# Patient Record
Sex: Female | Born: 1970 | Race: Black or African American | Hispanic: No | Marital: Single | Smoking: Never smoker
Health system: Southern US, Community
[De-identification: ages and names within clinical notes are randomized; demographics above are authoritative.]

---

## 2017-10-10 ENCOUNTER — Emergency Department (HOSPITAL_BASED_OUTPATIENT_CLINIC_OR_DEPARTMENT_OTHER)
Admission: EM | Admit: 2017-10-10 | Discharge: 2017-10-10 | Disposition: A | Discharge status: Home / Self Care | Payer: Self-pay | Attending: Emergency Medicine | Admitting: Emergency Medicine

## 2017-10-10 ENCOUNTER — Other Ambulatory Visit: Payer: Self-pay

## 2017-10-10 ENCOUNTER — Emergency Department (HOSPITAL_BASED_OUTPATIENT_CLINIC_OR_DEPARTMENT_OTHER): Payer: Self-pay

## 2017-10-10 ENCOUNTER — Encounter (HOSPITAL_BASED_OUTPATIENT_CLINIC_OR_DEPARTMENT_OTHER): Payer: Self-pay | Admitting: Emergency Medicine

## 2017-10-10 DIAGNOSIS — M545 Low back pain, unspecified: Secondary | ICD-10-CM

## 2017-10-10 DIAGNOSIS — X500XXA Overexertion from strenuous movement or load, initial encounter: Secondary | ICD-10-CM | POA: Insufficient documentation

## 2017-10-10 DIAGNOSIS — R109 Unspecified abdominal pain: Secondary | ICD-10-CM | POA: Insufficient documentation

## 2017-10-10 DIAGNOSIS — R319 Hematuria, unspecified: Secondary | ICD-10-CM | POA: Insufficient documentation

## 2017-10-10 DIAGNOSIS — Y999 Unspecified external cause status: Secondary | ICD-10-CM | POA: Insufficient documentation

## 2017-10-10 DIAGNOSIS — Y9389 Activity, other specified: Secondary | ICD-10-CM | POA: Insufficient documentation

## 2017-10-10 DIAGNOSIS — Y929 Unspecified place or not applicable: Secondary | ICD-10-CM | POA: Insufficient documentation

## 2017-10-10 LAB — CBC
HCT: 36.7 % (ref 36.0–46.0)
Hemoglobin: 12.6 g/dL (ref 12.0–15.0)
MCH: 32.4 pg (ref 26.0–34.0)
MCHC: 34.3 g/dL (ref 30.0–36.0)
MCV: 94.3 fL (ref 78.0–100.0)
PLATELETS: 213 10*3/uL (ref 150–400)
RBC: 3.89 MIL/uL (ref 3.87–5.11)
RDW: 12.1 % (ref 11.5–15.5)
WBC: 7.7 10*3/uL (ref 4.0–10.5)

## 2017-10-10 LAB — URINALYSIS, MICROSCOPIC (REFLEX)

## 2017-10-10 LAB — URINALYSIS, ROUTINE W REFLEX MICROSCOPIC
BILIRUBIN URINE: NEGATIVE
Glucose, UA: NEGATIVE mg/dL
Ketones, ur: NEGATIVE mg/dL
LEUKOCYTES UA: NEGATIVE
NITRITE: NEGATIVE
PH: 6 (ref 5.0–8.0)
Protein, ur: NEGATIVE mg/dL

## 2017-10-10 LAB — PREGNANCY, URINE: PREG TEST UR: NEGATIVE

## 2017-10-10 MED ORDER — NAPROXEN 500 MG PO TABS: 500.0000 mg | ORAL_TABLET | Freq: Two times a day (BID) | ORAL | 0 refills | Status: AC

## 2017-10-10 MED ORDER — KETOROLAC TROMETHAMINE 15 MG/ML IJ SOLN
15.0000 mg | Freq: Once | INTRAMUSCULAR | Status: AC
  Administered 2017-10-10: 15 mg via INTRAVENOUS
  Filled 2017-10-10: qty 1

## 2017-10-10 MED ORDER — METHOCARBAMOL 500 MG PO TABS: 500.0000 mg | ORAL_TABLET | Freq: Three times a day (TID) | ORAL | 0 refills | Status: AC | PRN

## 2017-10-10 NOTE — Discharge Instructions (Addendum)
You were seen in the emergency department today for back pain.  Your work-up in the emergency department was reassuring.  Your blood work was all fairly normal.  Your CT scan did not show a kidney stone or problems with your intestines.  Your CT scan did show some enlargement of your uterus which is likely due to fibroids, we feel that this is less likely to be the cause of your pain.  Your urine did show a small amount of blood as well as some bacteria, we are sending this off for culture to determine if this could be a urinary tract infection, if the culture is positive we will call you and treated with antibiotics accordingly.  At this time we suspect that this is musculoskeletal in nature and therefore treating with the following medicines:  - - Naproxen is a nonsteroidal anti-inflammatory medication that will help with pain and swelling. Be sure to take this medication as prescribed with food, 1 pill every 12 hours,  It should be taken with food, as it can cause stomach upset, and more seriously, stomach bleeding. Do not take other nonsteroidal anti-inflammatory medications with this such as Advil, Motrin, Aleve, Mobic, Goodie Powder, or Motrin.    - Robaxin is the muscle relaxer I have prescribed, this is meant to help with muscle tightness. Be aware that this medication may make you drowsy therefore the first time you take this it should be at a time you are in an environment where you can rest. Do not drive or operate heavy machinery when taking this medication. Do not drink alcohol or take other sedating medications with this medicine such as narcotics or benzodiazepines.   You make take Tylenol per over the counter dosing with these medications.   We have prescribed you new medication(s) today. Discuss the medications prescribed today with your pharmacist as they can have adverse effects and interactions with your other medicines including over the counter and prescribed medications. Seek medical  evaluation if you start to experience new or abnormal symptoms after taking one of these medicines, seek care immediately if you start to experience difficulty breathing, feeling of your throat closing, facial swelling, or rash as these could be indications of a more serious allergic reaction   We would like you to follow-up with your primary care provider for reevaluation within 1 week.  If you do not have a primary care provider we have given you information for our [facility], call to make an appointment.  Return to the ER for new or worsening symptoms or any other concerns.

## 2017-10-10 NOTE — ED Provider Notes (Signed)
[facility] EMERGENCY DEPARTMENT Provider Note   CSN: [id number] Arrival date & time: 10/10/17  1444     History   Chief Complaint Chief Complaint  Patient presents with  . Flank Pain    HPI Becky Baker is a 47 y.o. female with a hx of prior cesarean section who presents to the ED with complaints of L flank pain for the past 3 weeks. Patient describes the pain as being to the L flank to lower back radiating to L side. Pain is constant with occasional increases in severity, no specific triggers to this or specific alleviating/aggravating factors. She states pain had onset after heavy lifting with moving, she was seen in an alternative ER and given a muscle relaxant which she was unable to afford. She has not had significant change in her pain. She has not tried at home intervention. She notes some urinary frequency initially that has since resolved. She may have had blood in her urine but is unsure. Denies nausea, vomiting, diarrhea, blood in stool,  numbness, tingling, weakness, incontinence to bowel/bladder, fever, chills, IV drug use, or hx of cancer.    HPI  History reviewed. No pertinent past medical history.  There are no active problems to display for this patient.   Past Surgical History:  Procedure Laterality Date  . CESAREAN SECTION       OB History   None      Home Medications    Prior to Admission medications   Not on File    Family History No family history on file.  Social History Social History   Tobacco Use  . Smoking status: Never Smoker  . Smokeless tobacco: Never Used  Substance Use Topics  . Alcohol use: Never    Frequency: Never  . Drug use: Never     Allergies   Patient has no known allergies.   Review of Systems Review of Systems  Constitutional: Negative for chills and fever.  Respiratory: Negative for shortness of breath.   Cardiovascular: Negative for chest pain.  Gastrointestinal: Negative for blood in stool,  constipation, diarrhea, nausea and vomiting.  Genitourinary: Positive for flank pain, frequency (resolved at present) and hematuria (+/-). Negative for dysuria, pelvic pain, vaginal bleeding and vaginal discharge.  Musculoskeletal: Positive for back pain.  Skin: Negative for rash.  Neurological: Negative for weakness and numbness.  All other systems reviewed and are negative.  Physical Exam Updated Vital Signs BP 129/80   Pulse 83   Temp 98.2 F (36.8 C) (Oral)   Resp 18   Ht 5\' 3"  (1.6 m)   Wt 84.4 kg   LMP 03/10/2017 Comment: Pt reports some spotting 3 days ago  SpO2 99%   BMI 32.95 kg/m   Physical Exam  Constitutional: She appears well-developed and well-nourished.  Non-toxic appearance. No distress.  HENT:  Head: Normocephalic and atraumatic.  Eyes: Conjunctivae are normal. Right eye exhibits no discharge. Left eye exhibits no discharge.  Neck: Neck supple.  Cardiovascular: Normal rate and regular rhythm.  Pulmonary/Chest: Effort normal and breath sounds normal. No respiratory distress. She has no wheezes. She has no rhonchi. She has no rales.  Respiration even and unlabored  Abdominal: Soft. She exhibits no distension. There is no tenderness. There is CVA tenderness (L sided extending to L sided lumbar paraspinal muscles). There is no rigidity, no rebound and no guarding.  Neurological: She is alert.  Clear speech. Sensation grossly intact to bilateral lower extremities. 5/5 symmetric strength with plantar/dorsiflexion bilaterally. Ambulatory  with steady gait.   Skin: Skin is warm and dry. No rash noted.  Psychiatric: She has a normal mood and affect. Her behavior is normal.  Nursing note and vitals reviewed.  ED Treatments / Results  Labs (all labs ordered are listed, but only abnormal results are displayed) Labs Reviewed  URINALYSIS, ROUTINE W REFLEX MICROSCOPIC - Abnormal; Notable for the following components:      Result Value   Color, Urine STRAW (*)    Specific  Gravity, Urine <1.005 (*)    Hgb urine dipstick TRACE (*)    All other components within normal limits  URINALYSIS, MICROSCOPIC (REFLEX) - Abnormal; Notable for the following components:   Bacteria, UA MANY (*)    All other components within normal limits  URINE CULTURE  CBC  BASIC METABOLIC PANEL  PREGNANCY, URINE    EKG None  Radiology Ct Renal Stone Study  Result Date: 10/10/2017 CLINICAL DATA:  Left flank pain for 4 days. EXAM: CT ABDOMEN AND PELVIS WITHOUT CONTRAST TECHNIQUE: Multidetector CT imaging of the abdomen and pelvis was performed following the standard protocol without IV contrast. COMPARISON:  None. FINDINGS: Lower chest: Unremarkable Hepatobiliary: Contracted gallbladder.  Otherwise unremarkable. Pancreas: Unremarkable Spleen: Unremarkable Adrenals/Urinary Tract: Unremarkable Stomach/Bowel: Unremarkable.  Normal appendix. Vascular/Lymphatic: Aortoiliac atherosclerotic vascular disease. Reproductive: Somewhat elongated uterus at 14 cm in length along the endometrium. Posterior convexity of the uterus favoring posterior intramural for isodense fibroids. Adnexa unremarkable. Other: No supplemental non-categorized findings. Musculoskeletal: Disc bulge at L4-5. IMPRESSION: 1. A cause for the patient's left flank pain is not identified. No urinary tract calculus is observed and the bowel appears normal. 2. Enlarged uterus primarily from suspected posterior fibroids. If the patient's pain is pelvic in origin pelvic sonography be utilized for further characterization. 3. Disc bulge at L4-5. Electronically Signed   By: Gaylyn Rong M.D.   On: 10/10/2017 19:17    Procedures Procedures (including critical care time)  Medications Ordered in ED Medications  ketorolac (TORADOL) 15 MG/ML injection 15 mg (has no administration in time range)    Initial Impression / Assessment and Plan / ED Course  I have reviewed the triage vital signs and the nursing notes.  Pertinent labs &  imaging results that were available during my care of the patient were reviewed by me and considered in my medical decision making (see chart for details).    Patient presents with complaint of back/flank pain for past 3 weeks with intermittent urinary sxs as well.  Patient is nontoxic appearing, vitals are WNL. Urinalysis per triage with trace hgb and many bacterial, however nitrite/leuk negative, will culture, but doubt UTI/pyelonephritis based on current UA. Basic labs and CT renal study subsequently obtained. CBC unremarkable- no leukocytosis or anemia. Pregnancy test is negative. CT study without identifiable cause for the patient's left flank pain- No urinary tract calculus is observed and the bowel appears normal. There is mention of an enlarged uterus primarily from suspected posterior fibroids. If the patient's pain is pelvic in origin pelvic sonography be utilized for further characterization- patient's pain does not seem pelvic in nature given lack of tenderness in this area and her reports of primary L back/flank pain. There is also mention of disc bulge at L4-5. Patient's BMP hemolyzed x 2, given lack of nephrolithiasis do not feel this needs 3rd drawing.   Patient has normal neurologic exam, no midline tenderness to palpation. She is ambulatory in the ED.  No back pain red flags.  Given history and work  up feel that muscle strain is most likely at this time. Other than what is mentioned above additionally considered aortic aneurysm/dissection, pulmonary embolism, cauda equina or epidural abscess however these do not seem to fit clinical picture at this time. Will treat with Naproxen and Robaxin, discussed with patient that they are not to drive or operate heavy machinery while taking Robaxin. I discussed treatment plan, need for PCP follow-up, and return precautions with the patient. Provided opportunity for questions, patient confirmed understanding and is in agreement with plan.   Findings and  plan of care discussed with supervising physician Dr. Denton Lank who is in agreement.   Final Clinical Impressions(s) / ED Diagnoses   Final diagnoses:  Left-sided low back pain without sciatica, unspecified chronicity    ED Discharge Orders    None       Desmond Lope 10/10/17 2012    Cathren Laine, MD 10/10/17 2315

## 2017-10-10 NOTE — ED Notes (Signed)
Pt/family verbalized understanding of discharge instructions.   

## 2017-10-10 NOTE — ED Triage Notes (Signed)
Pt c/o left flank pain x 4 days. Pt denies urinary symptoms, Nausea or vomiting. Pt reports having chills.

## 2017-10-12 LAB — URINE CULTURE

## 2019-06-16 IMAGING — CT CT RENAL STONE PROTOCOL
2 of 4 series · 17 of 46 positions shown, 19 images · non-contrast
Comparison: None.

CLINICAL DATA: Left flank pain for 4 days.

EXAM:
CT ABDOMEN AND PELVIS WITHOUT CONTRAST
TECHNIQUE: Multidetector CT imaging of the abdomen and pelvis was performed
following the standard protocol without IV contrast.

[Series 2: axial st · axial · 0.73mm/px · z∈[+910,+1275]mm · 14 of 81 slices shown, 16 images]
[im 4/81  soft-tissue]
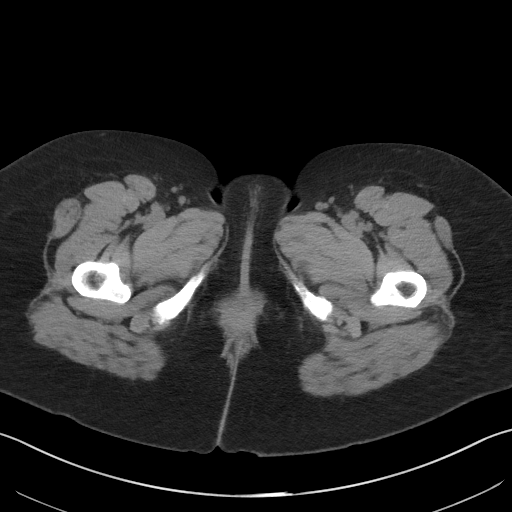
[im 4/81  bone]
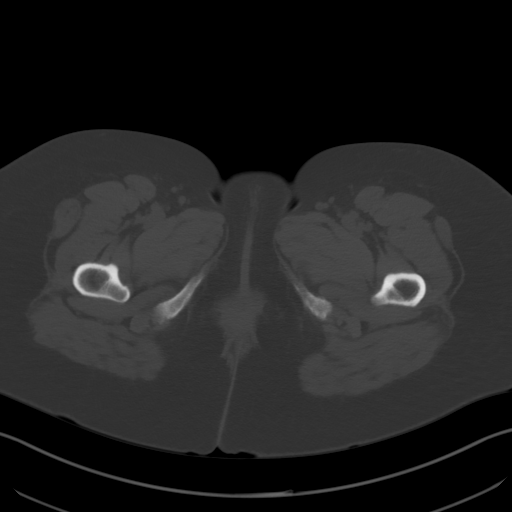
[im 10/81  soft-tissue]
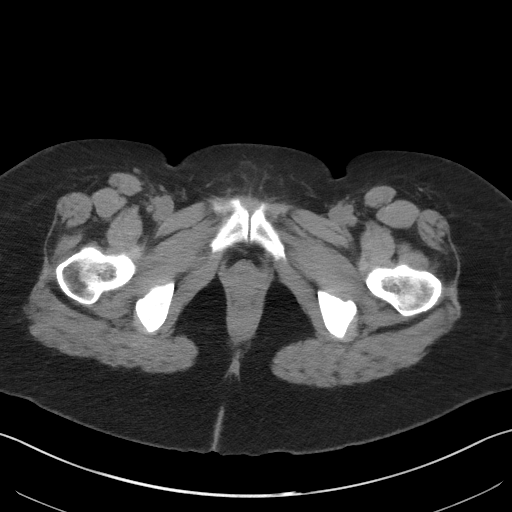
[im 17/81  soft-tissue]
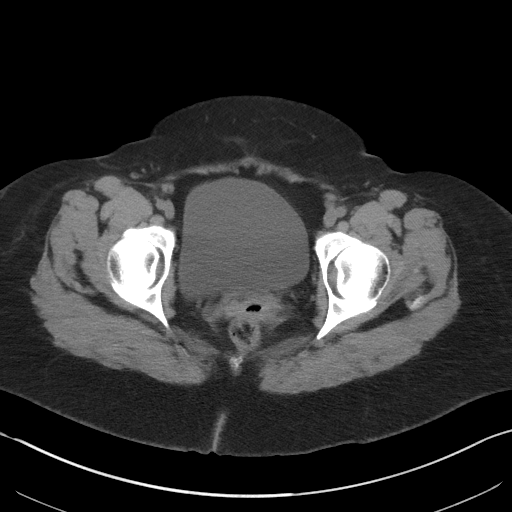
[im 23/81  soft-tissue]
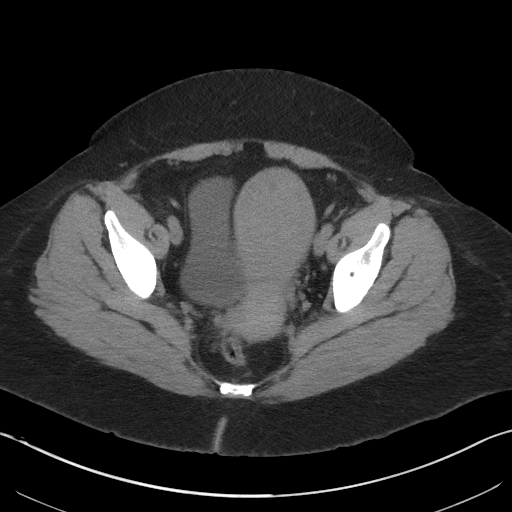
[im 26/81  soft-tissue]
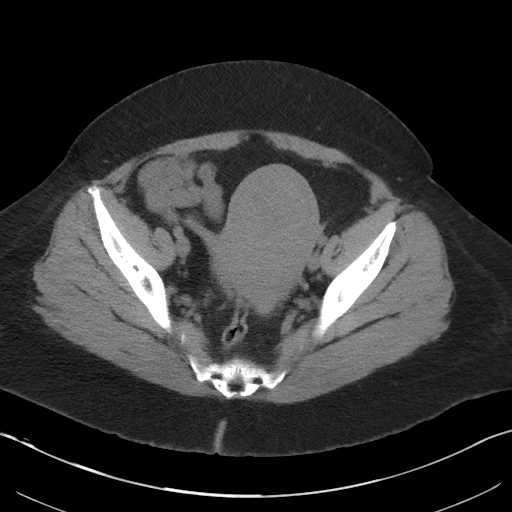
[im 33/81  soft-tissue]
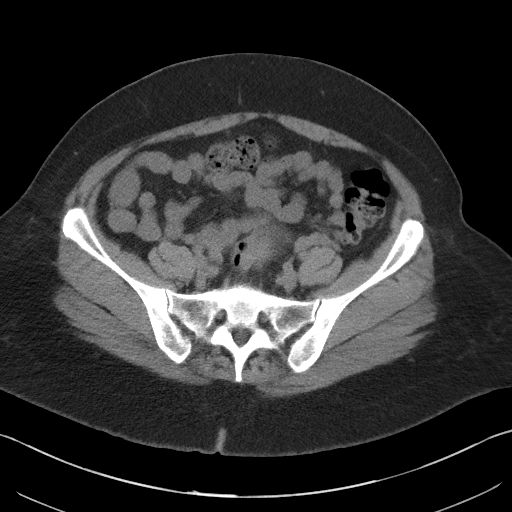
[im 39/81  soft-tissue]
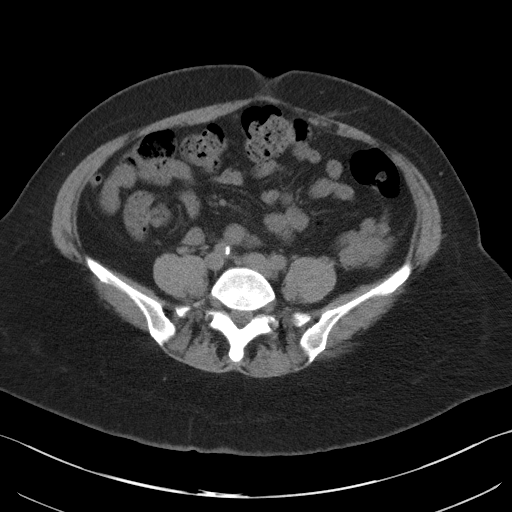
[im 42/81  soft-tissue]
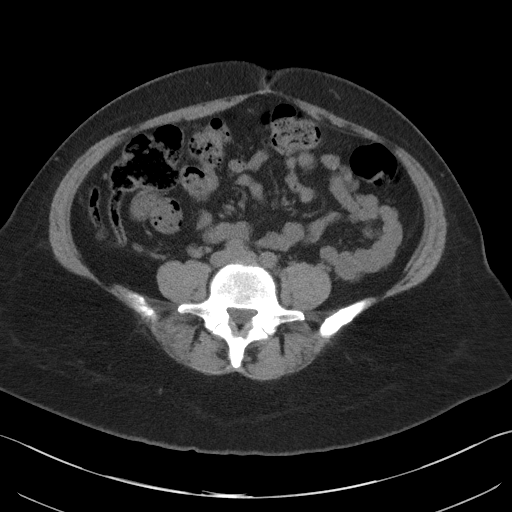
[im 49/81  soft-tissue]
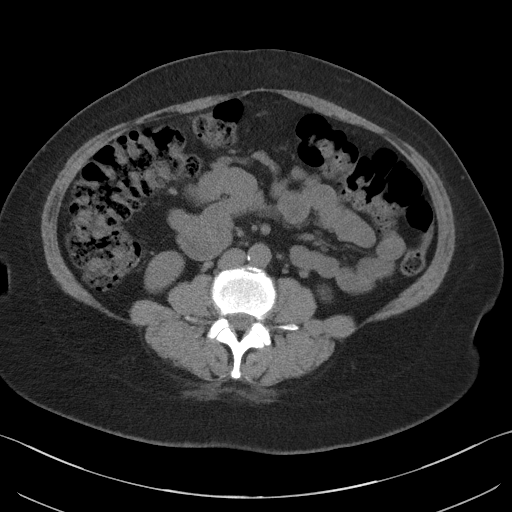
[im 49/81  bone]
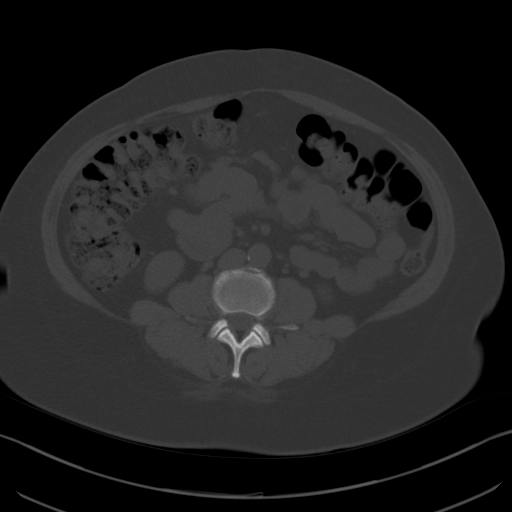
[im 55/81  soft-tissue]
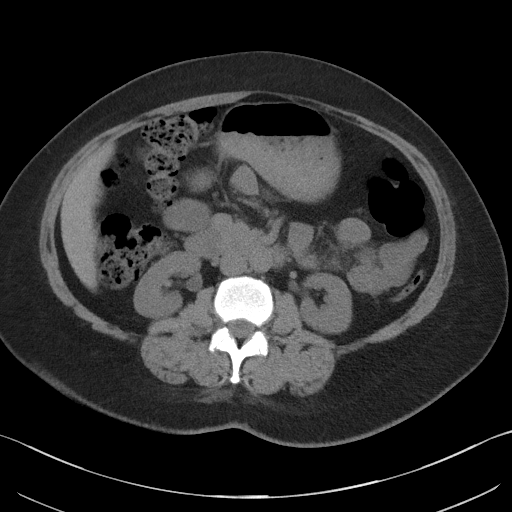
[im 61/81  soft-tissue]
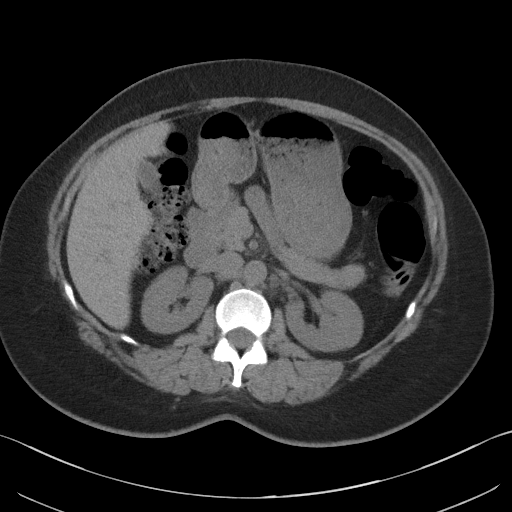
[im 65/81  soft-tissue]
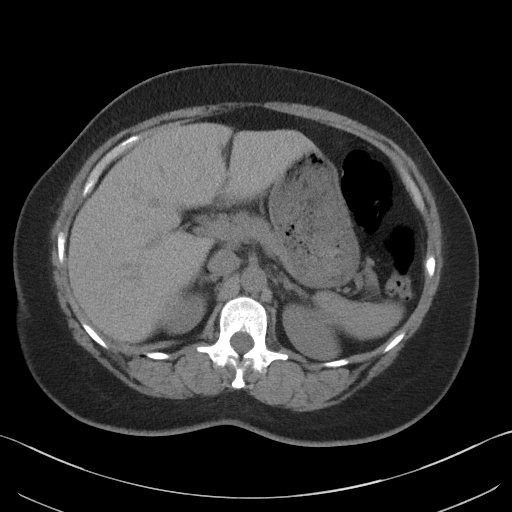
[im 71/81  soft-tissue]
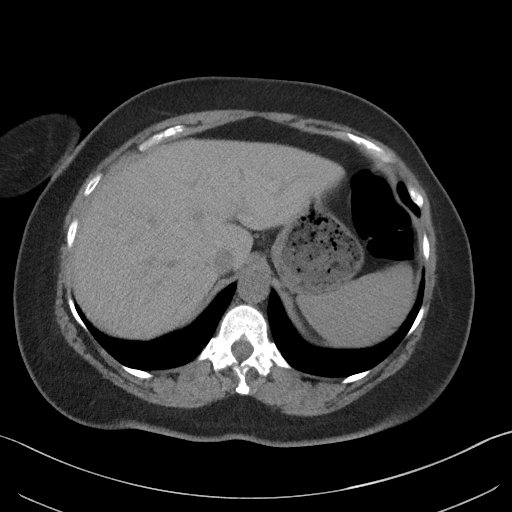
[im 77/81  soft-tissue]
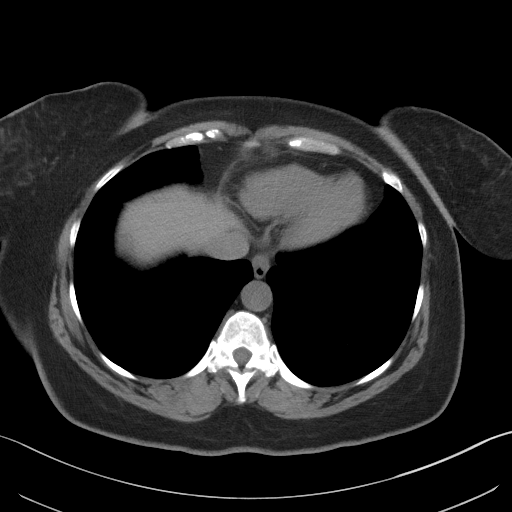

[Series 4: coronal st · coronal · 0.79mm/px · 3 of 101 slices shown]
[im 34/101  soft-tissue]
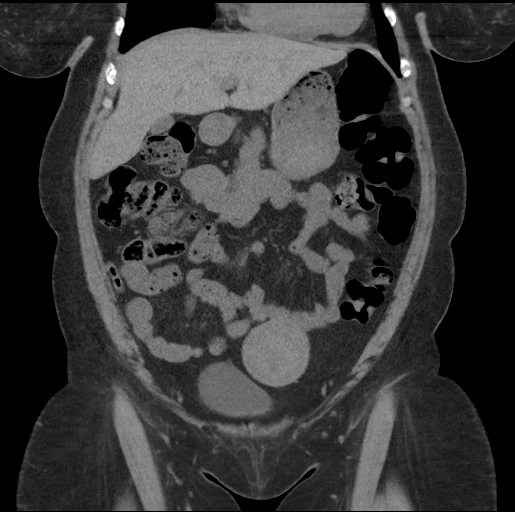
[im 45/101  soft-tissue]
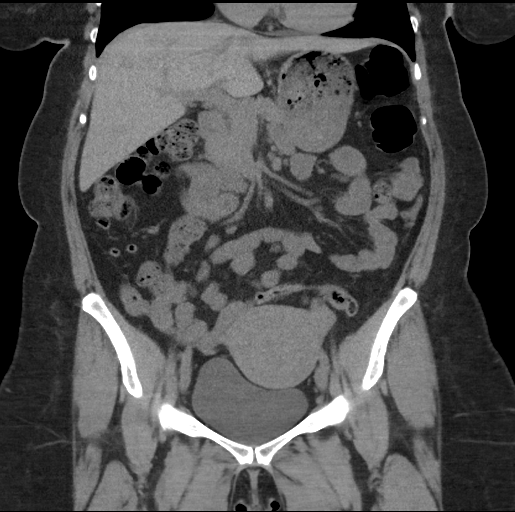
[im 56/101  soft-tissue]
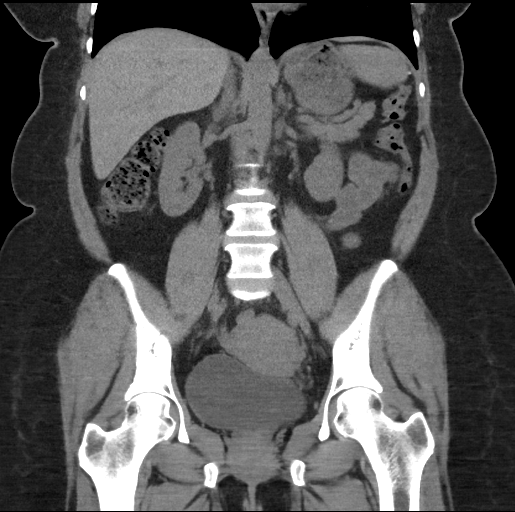

[17 of 46 positions shown; findings below may reference images not displayed]

FINDINGS: Lower chest: Unremarkable

Hepatobiliary: Contracted gallbladder.  Otherwise unremarkable.

Pancreas: Unremarkable

Spleen: Unremarkable

Adrenals/Urinary Tract: Unremarkable

Stomach/Bowel: Unremarkable.  Normal appendix.

Vascular/Lymphatic: Aortoiliac atherosclerotic vascular disease.

Reproductive: Somewhat elongated uterus at 14 cm in length along the
endometrium. Posterior convexity of the uterus favoring posterior
intramural for isodense fibroids. Adnexa unremarkable.

Other: No supplemental non-categorized findings.

Musculoskeletal: Disc bulge at L4-5.
IMPRESSION: 1. A cause for the patient's left flank pain is not identified. No
urinary tract calculus is observed and the bowel appears normal.
2. Enlarged uterus primarily from suspected posterior fibroids. If
the patient's pain is pelvic in origin pelvic sonography be utilized
for further characterization.
3. Disc bulge at L4-5.

## 2020-11-26 ENCOUNTER — Emergency Department
Admission: EM | Admit: 2020-11-26 | Discharge: 2020-11-26 | Disposition: A | Discharge status: Home / Self Care | Payer: BC Managed Care – PPO | Attending: Emergency Medicine | Admitting: Emergency Medicine

## 2020-11-26 ENCOUNTER — Other Ambulatory Visit: Payer: Self-pay

## 2020-11-26 ENCOUNTER — Encounter: Payer: Self-pay | Admitting: Emergency Medicine

## 2020-11-26 ENCOUNTER — Emergency Department: Payer: BC Managed Care – PPO

## 2020-11-26 DIAGNOSIS — Z20822 Contact with and (suspected) exposure to covid-19: Secondary | ICD-10-CM | POA: Insufficient documentation

## 2020-11-26 DIAGNOSIS — M791 Myalgia, unspecified site: Secondary | ICD-10-CM | POA: Diagnosis not present

## 2020-11-26 DIAGNOSIS — J029 Acute pharyngitis, unspecified: Secondary | ICD-10-CM | POA: Insufficient documentation

## 2020-11-26 DIAGNOSIS — R079 Chest pain, unspecified: Secondary | ICD-10-CM | POA: Diagnosis not present

## 2020-11-26 DIAGNOSIS — H6690 Otitis media, unspecified, unspecified ear: Secondary | ICD-10-CM

## 2020-11-26 DIAGNOSIS — R051 Acute cough: Secondary | ICD-10-CM | POA: Diagnosis not present

## 2020-11-26 DIAGNOSIS — R42 Dizziness and giddiness: Secondary | ICD-10-CM | POA: Diagnosis not present

## 2020-11-26 DIAGNOSIS — H6692 Otitis media, unspecified, left ear: Secondary | ICD-10-CM | POA: Insufficient documentation

## 2020-11-26 DIAGNOSIS — R509 Fever, unspecified: Secondary | ICD-10-CM | POA: Diagnosis present

## 2020-11-26 LAB — COMPREHENSIVE METABOLIC PANEL
ALT: 22 U/L (ref 0–44)
AST: 22 U/L (ref 15–41)
Albumin: 3.9 g/dL (ref 3.5–5.0)
Alkaline Phosphatase: 55 U/L (ref 38–126)
Anion gap: 7 (ref 5–15)
BUN: 13 mg/dL (ref 6–20)
CO2: 27 mmol/L (ref 22–32)
Calcium: 9.4 mg/dL (ref 8.9–10.3)
Chloride: 104 mmol/L (ref 98–111)
Creatinine, Ser: 0.83 mg/dL (ref 0.44–1.00)
GFR, Estimated: >60 mL/min (ref >60)
Glucose, Bld: 105 mg/dL — ABNORMAL HIGH (ref 70–99)
Potassium: 3.8 mmol/L (ref 3.5–5.1)
Sodium: 138 mmol/L (ref 135–145)
Total Bilirubin: 0.5 mg/dL (ref 0.3–1.2)
Total Protein: 7.6 g/dL (ref 6.5–8.1)

## 2020-11-26 LAB — CBC WITH DIFFERENTIAL/PLATELET
Abs Immature Granulocytes: 0.03 10*3/uL (ref 0.00–0.07)
Basophils Absolute: 0 10*3/uL (ref 0.0–0.1)
Basophils Relative: 0 %
Eosinophils Absolute: 0.1 10*3/uL (ref 0.0–0.5)
Eosinophils Relative: 2 %
HCT: 38 % (ref 36.0–46.0)
Hemoglobin: 12.7 g/dL (ref 12.0–15.0)
Immature Granulocytes: 0 %
Lymphocytes Relative: 28 %
Lymphs Abs: 1.9 10*3/uL (ref 0.7–4.0)
MCH: 32.2 pg (ref 26.0–34.0)
MCHC: 33.4 g/dL (ref 30.0–36.0)
MCV: 96.2 fL (ref 80.0–100.0)
Monocytes Absolute: 0.3 10*3/uL (ref 0.1–1.0)
Monocytes Relative: 5 %
Neutro Abs: 4.3 10*3/uL (ref 1.7–7.7)
Neutrophils Relative %: 65 %
Platelets: 183 10*3/uL (ref 150–400)
RBC: 3.95 MIL/uL (ref 3.87–5.11)
RDW: 12.2 % (ref 11.5–15.5)
WBC: 6.8 10*3/uL (ref 4.0–10.5)
nRBC: 0 % (ref 0.0–0.2)

## 2020-11-26 LAB — TROPONIN I (HIGH SENSITIVITY): Troponin I (High Sensitivity): 2 ng/L (ref <18)

## 2020-11-26 LAB — RESP PANEL BY RT-PCR (FLU A&B, COVID) ARPGX2
Influenza A by PCR: NEGATIVE
Influenza B by PCR: NEGATIVE
SARS Coronavirus 2 by RT PCR: NEGATIVE

## 2020-11-26 MED ORDER — AMOXICILLIN 500 MG PO CAPS: 500.0000 mg | ORAL_CAPSULE | Freq: Three times a day (TID) | ORAL | 0 refills | Status: AC

## 2020-11-26 MED ORDER — BENZONATATE 100 MG PO CAPS: 100.0000 mg | ORAL_CAPSULE | Freq: Three times a day (TID) | ORAL | 0 refills | Status: AC | PRN

## 2020-11-26 MED ORDER — ONDANSETRON 4 MG PO TBDP: 4.0000 mg | ORAL_TABLET | Freq: Three times a day (TID) | ORAL | 0 refills | Status: AC | PRN

## 2020-11-26 NOTE — ED Provider Notes (Signed)
[facility] Emergency Department Provider Note   ____________________________________________   Event Date/Time   First MD Initiated Contact with Patient 11/26/20 1328     (approximate)  I have reviewed the triage vital signs and the nursing notes.   HISTORY  Chief Complaint Emesis, Sore Throat, Generalized Body Aches, and Fever    HPI Becky Baker is a 50 y.o. female who comes in reporting 4 days of flulike symptoms with vomiting sore throat congestion body aches and fever.  Patient reports the sore throat came first and the other symptoms came later.  She is developed coughing and then today had some vertigo with vomiting and unsteadiness.  That has pretty much resolved completely now.  She still has complaints of coughing and aching all over and sore throat.  She also said she had some burning in the chest which she was not sure what it was from.      History reviewed. No pertinent past medical history.  There are no problems to display for this patient.   Past Surgical History:  Procedure Laterality Date   CESAREAN SECTION      Prior to Admission medications   Medication Sig Start Date End Date Taking? Authorizing Provider  amoxicillin (AMOXIL) 500 MG capsule Take 1 capsule (500 mg total) by mouth 3 (three) times daily. 11/26/20  Yes Arnaldo Natal, MD  benzonatate (TESSALON PERLES) 100 MG capsule Take 1 capsule (100 mg total) by mouth 3 (three) times daily as needed for cough. 11/26/20 11/26/21 Yes Arnaldo Natal, MD  methocarbamol (ROBAXIN) 500 MG tablet Take 1 tablet (500 mg total) by mouth every 8 (eight) hours as needed. 10/10/17   Petrucelli, Samantha R, PA-C  naproxen (NAPROSYN) 500 MG tablet Take 1 tablet (500 mg total) by mouth 2 (two) times daily. 10/10/17   Petrucelli, Pleas Koch, PA-C    Allergies Patient has no known allergies.  History reviewed. No pertinent family history.  Social History Social History   Tobacco Use    Smoking status: Never   Smokeless tobacco: Never  Vaping Use   Vaping Use: Never used  Substance Use Topics   Alcohol use: Never   Drug use: Never    Review of Systems  Constitutional: Subjective fever/chills Eyes: No visual changes. ENT: sore throat. Cardiovascular: Burning chest pain. Respiratory: Denies shortness of breath. Gastrointestinal: No abdominal pain.   nausea,  vomiting.  No diarrhea.  No constipation. Genitourinary: Negative for dysuria. Musculoskeletal: Negative for back pain. Skin: Negative for rash. Neurological: Negative for headaches, focal weakness or numbness.   ____________________________________________   PHYSICAL EXAM:  VITAL SIGNS: ED Triage Vitals  Enc Vitals Group     BP 11/26/20 1247 (!) 141/67     Pulse Rate 11/26/20 1247 71     Resp 11/26/20 1247 18     Temp 11/26/20 1247 97.6 F (36.4 C)     Temp Source 11/26/20 1247 Oral     SpO2 11/26/20 1247 100 %     Weight 11/26/20 1244 186 lb 1.1 oz (84.4 kg)     Height 11/26/20 1244 5\' 3"  (1.6 m)     Head Circumference --      Peak Flow --      Pain Score 11/26/20 1244 8     Pain Loc --      Pain Edu? --      Excl. in GC? --     Constitutional: Alert and oriented. Well appearing and in no acute distress.  Eyes: Conjunctivae are normal. PERRL. EOMI. fundi look normal Ears: There is some slight erythema of the left TM but mostly looks normal Head: Atraumatic. Nose: No congestion/rhinnorhea. Mouth/Throat: Mucous membranes are moist.  Oropharynx non-erythematous. Neck: No stridor.  Cardiovascular: Normal rate, regular rhythm. Grossly normal heart sounds.  Good peripheral circulation. Respiratory: Normal respiratory effort.  No retractions. Lungs CTAB. Gastrointestinal: Soft and nontender. No distention. No abdominal bruits.  Musculoskeletal: No lower extremity tenderness nor edema.   Neurologic:  Normal speech and language. No gross focal neurologic deficits are appreciated.  Skin:  Skin is  warm, dry and intact. No rash noted.   ____________________________________________   LABS (all labs ordered are listed, but only abnormal results are displayed)  Labs Reviewed  COMPREHENSIVE METABOLIC PANEL - Abnormal; Notable for the following components:      Result Value   Glucose, Bld 105 (*)    All other components within normal limits  RESP PANEL BY RT-PCR (FLU A&B, COVID) ARPGX2  CBC WITH DIFFERENTIAL/PLATELET  TROPONIN I (HIGH SENSITIVITY)   ____________________________________________  EKG   ____________________________________________  RADIOLOGY Jill Poling, personally viewed and evaluated these images (plain radiographs) as part of my medical decision making, as well as reviewing the written report by the radiologist.  ED MD interpretation:    Official radiology report(s): DG Chest Portable 1 View  Result Date: 11/26/2020 CLINICAL DATA:  Cough, burning in chest EXAM: PORTABLE CHEST 1 VIEW COMPARISON:  None. FINDINGS: The heart size and mediastinal contours are within normal limits. Both lungs are clear. The visualized skeletal structures are unremarkable. IMPRESSION: No acute abnormality of the lungs in AP portable projection. Electronically Signed   By: Jearld Lesch M.D.   On: 11/26/2020 14:33    ____________________________________________   PROCEDURES  Procedure(s) performed (including Critical Care):  Procedures   ____________________________________________   INITIAL IMPRESSION / ASSESSMENT AND PLAN / ED COURSE  ----------------------------------------- 2:55 PM on 11/26/2020 ----------------------------------------- After discussing things with the patient in more depth.  Apparently she had the sore throat and burning and then today was coughing and after coughing developed vertigo and then vomiting.  The vertigo and vomiting have now resolved.  I am unable to induce any vertigo with position changes of the head or sitting her up.  She is  able to stand and walk without difficulty now.  She has a history of vertigo in the past with ear problems.  She does have what appears to be a little bit of otitis media on the left.  Additionally the cough may have exacerbated her vertigo.  Her troponin is negative lab work is negative she has not got the flu or COVID at least not testing positive for any of them.  I will give her some Tessalon Perles for the cough and amoxicillin for the otitis and have her return if she has any further problems.  There does not appear to be any reason for an MRI or any other testing as the vertigo again has resolved completely.    I have also prescribed the patient a Zofran ODT in case she gets nauseated again.  Patient requested this.         ____________________________________________   FINAL CLINICAL IMPRESSION(S) / ED DIAGNOSES  Final diagnoses:  Acute otitis media, unspecified otitis media type  Acute cough  Vertigo     ED Discharge Orders          Ordered    amoxicillin (AMOXIL) 500 MG capsule  3 times  daily        11/26/20 1453    benzonatate (TESSALON PERLES) 100 MG capsule  3 times daily PRN        11/26/20 1453             Note:  This document was prepared using Dragon voice recognition software and may include unintentional dictation errors.    Arnaldo Natal, MD 11/26/20 1520

## 2020-11-26 NOTE — ED Triage Notes (Signed)
Pt comes into the ED via POV c/o flu like symptoms x 4 days with emesis,. Sore throat, congestion, body aches and fever.  Pt has even and unlabored respirations  with NAD.  EKG unremarkable with EMS.

## 2020-11-26 NOTE — Discharge Instructions (Signed)
Please return if your vertigo returns.  Please take amoxicillin 500 mg 3 times a day for your ear.  Use the Tessalon Perles 1 pill 3 times a day as needed for cough.  Make sure you swallow them do not suck on them.  If you suck on them I can make you quite sick.  Please do not hesitate to return if you feel sicker in any other way including fever or more vomiting or anything else.

## 2021-11-30 DIAGNOSIS — M533 Sacrococcygeal disorders, not elsewhere classified: Secondary | ICD-10-CM | POA: Diagnosis not present

## 2021-11-30 DIAGNOSIS — S336XXA Sprain of sacroiliac joint, initial encounter: Secondary | ICD-10-CM | POA: Diagnosis not present

## 2021-11-30 DIAGNOSIS — M5386 Other specified dorsopathies, lumbar region: Secondary | ICD-10-CM | POA: Diagnosis not present

## 2021-11-30 DIAGNOSIS — M79604 Pain in right leg: Secondary | ICD-10-CM | POA: Diagnosis not present

## 2021-11-30 DIAGNOSIS — M545 Low back pain, unspecified: Secondary | ICD-10-CM | POA: Diagnosis not present

## 2021-11-30 DIAGNOSIS — M549 Dorsalgia, unspecified: Secondary | ICD-10-CM | POA: Diagnosis not present

## 2021-11-30 DIAGNOSIS — R69 Illness, unspecified: Secondary | ICD-10-CM | POA: Diagnosis not present

## 2021-11-30 DIAGNOSIS — R030 Elevated blood-pressure reading, without diagnosis of hypertension: Secondary | ICD-10-CM | POA: Diagnosis not present

## 2021-11-30 DIAGNOSIS — Z79899 Other long term (current) drug therapy: Secondary | ICD-10-CM | POA: Diagnosis not present

## 2021-11-30 DIAGNOSIS — R2689 Other abnormalities of gait and mobility: Secondary | ICD-10-CM | POA: Diagnosis not present

## 2021-12-07 DIAGNOSIS — M25561 Pain in right knee: Secondary | ICD-10-CM | POA: Diagnosis not present

## 2021-12-07 DIAGNOSIS — M533 Sacrococcygeal disorders, not elsewhere classified: Secondary | ICD-10-CM | POA: Diagnosis not present

## 2021-12-07 DIAGNOSIS — M5441 Lumbago with sciatica, right side: Secondary | ICD-10-CM | POA: Diagnosis not present

## 2021-12-07 DIAGNOSIS — M1611 Unilateral primary osteoarthritis, right hip: Secondary | ICD-10-CM | POA: Diagnosis not present

## 2021-12-07 DIAGNOSIS — M79604 Pain in right leg: Secondary | ICD-10-CM | POA: Diagnosis not present

## 2021-12-07 DIAGNOSIS — M25551 Pain in right hip: Secondary | ICD-10-CM | POA: Diagnosis not present

## 2021-12-07 DIAGNOSIS — Z79899 Other long term (current) drug therapy: Secondary | ICD-10-CM | POA: Diagnosis not present

## 2021-12-07 DIAGNOSIS — M5431 Sciatica, right side: Secondary | ICD-10-CM | POA: Diagnosis not present

## 2021-12-07 DIAGNOSIS — M545 Low back pain, unspecified: Secondary | ICD-10-CM | POA: Diagnosis not present

## 2021-12-17 DIAGNOSIS — R9431 Abnormal electrocardiogram [ECG] [EKG]: Secondary | ICD-10-CM | POA: Diagnosis not present

## 2021-12-17 DIAGNOSIS — R079 Chest pain, unspecified: Secondary | ICD-10-CM | POA: Diagnosis not present
